# Patient Record
Sex: Male | Born: 1935 | Race: White | Hispanic: No | Smoking: Former smoker
Health system: Southern US, Community
[De-identification: ages and names within clinical notes are randomized; demographics above are authoritative.]

## PROBLEM LIST (undated history)

## (undated) DIAGNOSIS — N183 Chronic kidney disease, stage 3 unspecified: Secondary | ICD-10-CM

## (undated) DIAGNOSIS — E119 Type 2 diabetes mellitus without complications: Secondary | ICD-10-CM

## (undated) DIAGNOSIS — I10 Essential (primary) hypertension: Secondary | ICD-10-CM

## (undated) DIAGNOSIS — M1990 Unspecified osteoarthritis, unspecified site: Secondary | ICD-10-CM

## (undated) DIAGNOSIS — Z8619 Personal history of other infectious and parasitic diseases: Secondary | ICD-10-CM

## (undated) HISTORY — DX: Chronic kidney disease, stage 3 unspecified: N18.30

## (undated) HISTORY — DX: Essential (primary) hypertension: I10

## (undated) HISTORY — DX: Type 2 diabetes mellitus without complications: E11.9

## (undated) HISTORY — DX: Personal history of other infectious and parasitic diseases: Z86.19

## (undated) HISTORY — DX: Unspecified osteoarthritis, unspecified site: M19.90

---

## 2013-07-13 HISTORY — PX: TOTAL COLECTOMY: SHX852

## 2017-02-16 LAB — BASIC METABOLIC PANEL
BUN: 48 — AB (ref 4–21)
Glucose: 128
Potassium: 5.2 (ref 3.4–5.3)
Sodium: 140 (ref 137–147)

## 2017-02-16 LAB — HEMOGLOBIN A1C: Hemoglobin A1C: 6.4

## 2017-02-16 LAB — HEPATIC FUNCTION PANEL
ALT: 20 (ref 10–40)
AST: 17 (ref 14–40)
Bilirubin, Total: 0.5

## 2017-08-20 LAB — HEPATIC FUNCTION PANEL
ALT: 16 (ref 10–40)
AST: 16 (ref 14–40)
Alkaline Phosphatase: 44 (ref 25–125)
Bilirubin, Total: 0.5

## 2017-08-20 LAB — BASIC METABOLIC PANEL
Creatinine: 2.1 — AB (ref <=1.3)
Glucose: 120
Potassium: 4.6 (ref 3.4–5.3)
Sodium: 140 (ref 137–147)

## 2017-08-20 LAB — HEMOGLOBIN A1C: Hemoglobin A1C: 5.9

## 2017-12-24 LAB — BASIC METABOLIC PANEL
Glucose: 134
Potassium: 4.7 (ref 3.4–5.3)
Sodium: 149 — AB (ref 137–147)

## 2017-12-24 LAB — HEMOGLOBIN A1C: Hemoglobin A1C: 6.3

## 2017-12-24 LAB — HEPATIC FUNCTION PANEL
ALT: 13 (ref 10–40)
AST: 13 — AB (ref 14–40)
Bilirubin, Total: 0.6

## 2018-06-07 LAB — BASIC METABOLIC PANEL
BUN: 48 — AB (ref 4–21)
Creatinine: 2.3 — AB (ref <=1.3)
Glucose: 136
Potassium: 5.9 — AB (ref 3.4–5.3)
Sodium: 147 (ref 137–147)

## 2018-06-07 LAB — LIPID PANEL
Cholesterol: 184 (ref 0–200)
HDL: 64 (ref 35–70)
LDL Cholesterol: 99
Triglycerides: 110 (ref 40–160)

## 2018-06-07 LAB — CBC AND DIFFERENTIAL
HCT: 43 (ref 41–53)
Hemoglobin: 14.2 (ref 13.5–17.5)
Platelets: 159 (ref 150–399)
WBC: 6.1

## 2018-06-07 LAB — HEPATIC FUNCTION PANEL
ALT: 17 (ref 10–40)
AST: 15 (ref 14–40)
Alkaline Phosphatase: 50 (ref 25–125)
Bilirubin, Total: 0.5

## 2018-06-07 LAB — TSH: TSH: 1.91 (ref <=5.90)

## 2018-12-15 ENCOUNTER — Ambulatory Visit (INDEPENDENT_AMBULATORY_CARE_PROVIDER_SITE_OTHER): Payer: PPO | Admitting: Internal Medicine

## 2018-12-15 ENCOUNTER — Other Ambulatory Visit: Payer: Self-pay

## 2018-12-15 DIAGNOSIS — R413 Other amnesia: Secondary | ICD-10-CM | POA: Insufficient documentation

## 2018-12-15 DIAGNOSIS — N183 Chronic kidney disease, stage 3 unspecified: Secondary | ICD-10-CM

## 2018-12-15 DIAGNOSIS — R5383 Other fatigue: Secondary | ICD-10-CM | POA: Diagnosis not present

## 2018-12-15 DIAGNOSIS — I10 Essential (primary) hypertension: Secondary | ICD-10-CM

## 2018-12-15 DIAGNOSIS — E1122 Type 2 diabetes mellitus with diabetic chronic kidney disease: Secondary | ICD-10-CM | POA: Diagnosis not present

## 2018-12-16 LAB — CBC WITH DIFFERENTIAL/PLATELET
Basophils Absolute: 0.1 10*3/uL (ref 0.0–0.1)
Basophils Relative: 1.2 % (ref 0.0–3.0)
Eosinophils Absolute: 0.2 10*3/uL (ref 0.0–0.7)
Eosinophils Relative: 3.3 % (ref 0.0–5.0)
HCT: 40.2 % (ref 39.0–52.0)
Hemoglobin: 13.6 g/dL (ref 13.0–17.0)
Lymphocytes Relative: 25 % (ref 12.0–46.0)
Lymphs Abs: 1.6 10*3/uL (ref 0.7–4.0)
MCHC: 33.9 g/dL (ref 30.0–36.0)
MCV: 98.5 fl (ref 78.0–100.0)
Monocytes Absolute: 0.5 10*3/uL (ref 0.1–1.0)
Monocytes Relative: 7.5 % (ref 3.0–12.0)
Neutro Abs: 4.1 10*3/uL (ref 1.4–7.7)
Neutrophils Relative %: 63 % (ref 43.0–77.0)
Platelets: 163 10*3/uL (ref 150.0–400.0)
RBC: 4.08 Mil/uL — ABNORMAL LOW (ref 4.22–5.81)
RDW: 13.2 % (ref 11.5–15.5)
WBC: 6.6 10*3/uL (ref 4.0–10.5)

## 2018-12-16 LAB — BASIC METABOLIC PANEL
BUN: 50 mg/dL — ABNORMAL HIGH (ref 6–23)
CO2: 22 mEq/L (ref 19–32)
Calcium: 9.5 mg/dL (ref 8.4–10.5)
Chloride: 106 mEq/L (ref 96–112)
Creatinine, Ser: 1.86 mg/dL — ABNORMAL HIGH (ref 0.40–1.50)
GFR: 34.45 mL/min — ABNORMAL LOW (ref >60.00)
Glucose, Bld: 128 mg/dL — ABNORMAL HIGH (ref 70–99)
Potassium: 4.7 mEq/L (ref 3.5–5.1)
Sodium: 139 mEq/L (ref 135–145)

## 2018-12-16 LAB — LIPID PANEL
Cholesterol: 186 mg/dL (ref 0–200)
HDL: 49.9 mg/dL (ref >39.00)
LDL Cholesterol: 105 mg/dL — ABNORMAL HIGH (ref 0–99)
NonHDL: 135.77
Total CHOL/HDL Ratio: 4
Triglycerides: 154 mg/dL — ABNORMAL HIGH (ref 0.0–149.0)
VLDL: 30.8 mg/dL (ref 0.0–40.0)

## 2018-12-16 LAB — HEPATIC FUNCTION PANEL
ALT: 20 U/L (ref 0–53)
AST: 17 U/L (ref 0–37)
Albumin: 4.1 g/dL (ref 3.5–5.2)
Alkaline Phosphatase: 51 U/L (ref 39–117)
Bilirubin, Direct: 0.1 mg/dL (ref 0.0–0.3)
Total Bilirubin: 0.5 mg/dL (ref 0.2–1.2)
Total Protein: 6.8 g/dL (ref 6.0–8.3)

## 2018-12-16 LAB — VITAMIN B12: Vitamin B-12: 318 pg/mL (ref 211–911)

## 2018-12-16 LAB — TSH: TSH: 2.54 u[IU]/mL (ref 0.35–4.50)

## 2018-12-16 LAB — HEMOGLOBIN A1C: Hgb A1c MFr Bld: 7 % — ABNORMAL HIGH (ref 4.6–6.5)

## 2018-12-18 ENCOUNTER — Encounter: Payer: Self-pay | Admitting: Internal Medicine

## 2018-12-18 DIAGNOSIS — N183 Chronic kidney disease, stage 3 unspecified: Secondary | ICD-10-CM | POA: Insufficient documentation

## 2018-12-18 NOTE — Progress Notes (Signed)
Patient ID: Leroy Shepard, male   DOB: May 04, 1929, 85 y.o.   MRN: [id number]   Subjective:    Patient ID: Leroy Shepard, male    DOB: Oct 05, 1928, 85 y.o.   MRN: [id number]  HPI  Patient here for to establish care.  He has recently moved here from [place].  Living with his daughter and son-n-law.  Adjusting to the move.  Has a history of hypertension and diabetes.  He controls his diabetes with diet.  Is not very active.  Does report no chest pain or sob.  No acid reflux.  No abdominal pain.  Has ileostomy.  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  States ostomy is working well.  Eating well.  Has a good appetite.  No nausea or vomiting.  No urine change.  Answering questions appropriately.  Does report some possible depression.  States he feels down at times.  Discussed with him today.  Desires no further intervention at this time.     Past Medical History:  Diagnosis Date  . 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 Diabetes mellitus ([place])   . History of Clostridioides difficile colitis   . Hypertension   . Osteoarthritis    Past Surgical History:  Procedure Laterality Date  . TOTAL COLECTOMY  2015   toxic megacolon with resulting ileostomy   History reviewed. No pertinent family history. Social History   Socioeconomic History  . Marital status: Single    Spouse name: Not on file  . Number of children: Not on file  . Years of education: Not on file  . Highest education level: Not on file  Occupational History  . Not on file  Social Needs  . Financial resource strain: Not on file  . Food insecurity:    Worry: Not on file    Inability: Not on file  . Transportation needs:    Medical: Not on file    Non-medical: Not on file  Tobacco Use  . Smoking status: Former Research scientist (life sciences)  . Smokeless tobacco: Never Used  Substance and Sexual Activity  . Alcohol use: Not on file  . Drug  use: Not on file  . Sexual activity: Not on file  Lifestyle  . Physical activity:    Days per week: Not on file    Minutes per session: Not on file  . Stress: Not on file  Relationships  . Social connections:    Talks on phone: Not on file    Gets together: Not on file    Attends religious service: Not on file    Active member of club or organization: Not on file    Attends meetings of clubs or organizations: Not on file    Relationship status: Not on file  Other Topics Concern  . Not on file  Social History Narrative  . Not on file    Outpatient Encounter Medications as of 12/15/2018  Medication Sig  . Alpha-D-Galactosidase (BEANO PO) Take by mouth.  . Ascorbic Acid (VITAMIN C PO) Take by mouth daily.  . cholecalciferol (VITAMIN D3) 25 MCG (1000 UT) tablet Take 1,000 Units by mouth daily.  Marland Kitchen donepezil (ARICEPT) 5 MG tablet Take 5 mg by mouth at bedtime.  Marland Kitchen loratadine (CLARITIN) 10 MG tablet Take 10 mg by mouth daily.  Marland Kitchen losartan (COZAAR) 100 MG tablet Take 100 mg by mouth daily.  Marland Kitchen oxybutynin (DITROPAN-XL) 10 MG 24 hr tablet  Take 10 mg by mouth at bedtime.  . Simethicone (GAS-X PO) Take by mouth as needed.  . tamsulosin (FLOMAX) 0.4 MG CAPS capsule Take 0.4 mg by mouth daily after supper.   No facility-administered encounter medications on file as of 12/15/2018.     Review of Systems  Constitutional: Negative for appetite change and unexpected weight change.  HENT: Negative for congestion and sinus pressure.   Respiratory: Negative for cough, chest tightness and shortness of breath.   Cardiovascular: Negative for chest pain, palpitations and leg swelling.  Gastrointestinal: Negative for abdominal pain, nausea and vomiting.  Genitourinary: Negative for difficulty urinating and dysuria.  Musculoskeletal: Negative for joint swelling.  Skin: Negative for color change and rash.  Neurological: Negative for dizziness, light-headedness and headaches.  Psychiatric/Behavioral:  Negative for agitation.       Feels down intermittently.         Objective:    Physical Exam Constitutional:      General: He is not in acute distress.    Appearance: Normal appearance. He is well-developed.  HENT:     Head: Normocephalic and atraumatic.     Right Ear: External ear normal.     Left Ear: External ear normal.  Eyes:     General:        Right eye: No discharge.        Left eye: No discharge.     Conjunctiva/sclera: Conjunctivae normal.  Neck:     Musculoskeletal: Neck supple. No muscular tenderness.  Cardiovascular:     Rate and Rhythm: Normal rate and regular rhythm.  Pulmonary:     Effort: Pulmonary effort is normal. No respiratory distress.     Breath sounds: Normal breath sounds.  Abdominal:     General: Bowel sounds are normal.     Palpations: Abdomen is soft.     Tenderness: There is no abdominal tenderness.     Comments: No tenderness to palpation around the ostomy.  No surrounding the erythema.    Musculoskeletal:        General: No swelling or tenderness.  Lymphadenopathy:     Cervical: No cervical adenopathy.  Skin:    Findings: No erythema or rash.  Neurological:     Mental Status: He is alert.     Comments: New year.  Unable to give month of day of month.  Able to spell world backwards and subtract serial 7s.  New the president.  Able to recall 1/3 objects.    Psychiatric:        Mood and Affect: Mood normal.        Behavior: Behavior normal.     BP 122/62   Pulse 80   Temp 97.9 F (36.6 C) (Oral)   Resp 16   Ht 5' 5"  (1.651 m)   Wt 175 lb (79.4 kg)   SpO2 97%   BMI 29.12 kg/m  Wt Readings from Last 3 Encounters:  12/15/18 175 lb (79.4 kg)     Lab Results  Component Value Date   WBC 6.6 12/15/2018   HGB 13.6 12/15/2018   HCT 40.2 12/15/2018   PLT 163.0 12/15/2018   GLUCOSE 128 (H) 12/15/2018   CHOL 186 12/15/2018   TRIG 154.0 (H) 12/15/2018   HDL 49.90 12/15/2018   LDLCALC 105 (H) 12/15/2018   ALT 20 12/15/2018   AST  17 12/15/2018   NA 139 12/15/2018   K 4.7 12/15/2018   CL 106 12/15/2018   CREATININE 1.86 (H) 12/15/2018  BUN 50 (H) 12/15/2018   CO2 22 12/15/2018   TSH 2.54 12/15/2018   HGBA1C 7.0 (H) 12/15/2018    Patient was never admitted.     Assessment & Plan:   Problem List Items Addressed This Visit    CKD (chronic kidney disease), stage III ([place])    Follow metabolic panel.  Avoid antiinflammatories.        Fatigue    Felt to be multifactorial.  Discussed with him today.  Check routine labs.  Will follow.  Discussed his recent move and transition. Discussed his mood, etc.  Desires no further intervention at this time.  Will follow.        Hypertension, essential    Controlled on current medication regimen.  Follow pressures.  Follow metabolic panel.        Relevant Medications   losartan (COZAAR) 100 MG tablet   Other Relevant Orders   CBC with Differential/Platelet (Completed)   Memory change    Check routine labs including cbc, metabolic panel, thyroid test and B12 level.        Relevant Orders   TSH (Completed)   Vitamin B12 (Completed)   Type 2 diabetes mellitus with diabetic chronic kidney disease ([place])    Controls with diet.  On no medication.  Follow met b and a1c.        Relevant Medications   losartan (COZAAR) 100 MG tablet   Other Relevant Orders   Hepatic function panel (Completed)   Hemoglobin A1c (Completed)   Lipid panel (Completed)   Basic metabolic panel (Completed)       Einar Pheasant, MD

## 2018-12-18 NOTE — Assessment & Plan Note (Signed)
Controls with diet.  On no medication.  Follow met b and a1c.   

## 2018-12-18 NOTE — Assessment & Plan Note (Signed)
Follow metabolic panel.  Avoid antiinflammatories.   

## 2018-12-18 NOTE — Assessment & Plan Note (Signed)
Controlled on current medication regimen.  Follow pressures.  Follow metabolic panel.

## 2018-12-18 NOTE — Assessment & Plan Note (Signed)
Felt to be multifactorial.  Discussed with him today.  Check routine labs.  Will follow.  Discussed his recent move and transition. Discussed his mood, etc.  Desires no further intervention at this time.  Will follow.

## 2018-12-18 NOTE — Assessment & Plan Note (Signed)
Check routine labs including cbc, metabolic panel, thyroid test and B12 level.

## 2018-12-19 ENCOUNTER — Other Ambulatory Visit: Payer: Self-pay | Admitting: Internal Medicine

## 2018-12-19 MED ORDER — CYANOCOBALAMIN 1000 MCG/ML IJ SOLN: INTRAMUSCULAR | 1 refills | Status: DC

## 2018-12-19 NOTE — Progress Notes (Signed)
rx sent in for B12

## 2018-12-22 ENCOUNTER — Other Ambulatory Visit: Payer: Self-pay | Admitting: Internal Medicine

## 2018-12-22 DIAGNOSIS — Z9049 Acquired absence of other specified parts of digestive tract: Secondary | ICD-10-CM | POA: Insufficient documentation

## 2018-12-22 NOTE — Progress Notes (Signed)
Order placed for surgery consultation.

## 2018-12-27 ENCOUNTER — Encounter: Payer: Self-pay | Admitting: Internal Medicine

## 2018-12-29 ENCOUNTER — Other Ambulatory Visit: Payer: Self-pay

## 2018-12-29 ENCOUNTER — Ambulatory Visit (INDEPENDENT_AMBULATORY_CARE_PROVIDER_SITE_OTHER): Payer: HMO | Admitting: General Surgery

## 2018-12-29 DIAGNOSIS — Z432 Encounter for attention to ileostomy: Secondary | ICD-10-CM

## 2018-12-29 NOTE — Progress Notes (Addendum)
Patient ID: Leroy Shepard, male   DOB: [birth year], 85 y.o.   MRN: [id number]  No chief complaint on file.   HPI Leroy Shepard is a 85 y.o. male.  The patient recently moved from [place] to [place] to live with his daughter.  4 years ago he underwent an emergency colectomy for C. difficile colitis and has a permanent ileostomy.  The family handles stoma management. The patient was seen at his home. HPI  Past Medical History:  Diagnosis Date  . 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 Diabetes mellitus ([place])   . History of Clostridioides difficile colitis   . Hypertension   . Osteoarthritis     Past Surgical History:  Procedure Laterality Date  . TOTAL COLECTOMY  2015   toxic megacolon with resulting ileostomy  . TOTAL COLECTOMY      Family History  Problem Relation Age of Onset  . Cancer Sister     Social History Social History   Tobacco Use  . Smoking status: Former Research scientist (life sciences)  . Smokeless tobacco: Never Used  Substance Use Topics  . Alcohol use: Not on file  . Drug use: Not on file    Not on File  Current Outpatient Medications  Medication Sig Dispense Refill  . Alpha-D-Galactosidase (BEANO PO) Take by mouth.    . Ascorbic Acid (VITAMIN C PO) Take by mouth daily.    . cholecalciferol (VITAMIN D3) 25 MCG (1000 UT) tablet Take 1,000 Units by mouth daily.    . cyanocobalamin (,VITAMIN B-12,) 1000 MCG/ML injection Inject 1069mcg q week x 4 weeks and then 1038mcg q month. 10 mL 1  . donepezil (ARICEPT) 5 MG tablet Take 5 mg by mouth at bedtime.    Marland Kitchen loratadine (CLARITIN) 10 MG tablet Take 10 mg by mouth daily.    Marland Kitchen losartan (COZAAR) 100 MG tablet Take 100 mg by mouth daily.    Marland Kitchen oxybutynin (DITROPAN-XL) 10 MG 24 hr tablet Take 10 mg by mouth at bedtime.    . Simethicone (GAS-X PO) Take by mouth as needed.    . tamsulosin (FLOMAX) 0.4 MG CAPS capsule Take 0.4 mg by mouth daily after supper.      No current facility-administered medications for this visit.     Review of Systems Review of Systems  Respiratory: Negative.   Cardiovascular: Negative.   Gastrointestinal: Negative.   Endocrine: Positive for cold intolerance.  Genitourinary: Negative.     There were no vitals taken for this visit.  Physical Exam Physical Exam Abdominal:     General: Abdomen is protuberant. A surgical scar is present. There is no distension.     Palpations: Abdomen is soft.       Data Reviewed CBC of December 15, 2018 reviewed.  Hemoglobin of 13.6 with an MCV of 98.  White blood cell count of 6600 with normal differential.  Platelet count of 163,000. Hemoglobin A1c of 7.0.  Normal hepatic function panel.  Assessment Healthy ileostomy status post colectomy for C. difficile colitis.  Plan No issues at times time and follow-up will be on an as-needed basis.    Forest Gleason Deette Revak 12/29/2018, 7:42 PM

## 2019-01-05 ENCOUNTER — Other Ambulatory Visit: Payer: Self-pay | Admitting: Internal Medicine

## 2019-01-05 DIAGNOSIS — T17308A Unspecified foreign body in larynx causing other injury, initial encounter: Secondary | ICD-10-CM

## 2019-01-05 DIAGNOSIS — R413 Other amnesia: Secondary | ICD-10-CM

## 2019-01-05 NOTE — Progress Notes (Signed)
Order placed for neurology referral and ENT referral.

## 2019-01-06 ENCOUNTER — Telehealth: Payer: Self-pay

## 2019-01-06 DIAGNOSIS — Z432 Encounter for attention to ileostomy: Secondary | ICD-10-CM

## 2019-01-06 NOTE — Telephone Encounter (Signed)
DME printed for ileostomy supplies

## 2019-01-20 ENCOUNTER — Other Ambulatory Visit: Payer: Self-pay | Admitting: Internal Medicine

## 2019-01-20 MED ORDER — BUPROPION HCL 75 MG PO TABS: 75.0000 mg | ORAL_TABLET | Freq: Two times a day (BID) | ORAL | 1 refills | Status: DC

## 2019-01-20 NOTE — Progress Notes (Signed)
rx sent in for wellbutrin 75mg  bid #60 with no refills.

## 2019-01-25 DIAGNOSIS — J9589 Other postprocedural complications and disorders of respiratory system, not elsewhere classified: Secondary | ICD-10-CM | POA: Diagnosis not present

## 2019-01-26 ENCOUNTER — Other Ambulatory Visit: Payer: Self-pay | Admitting: Unknown Physician Specialty

## 2019-01-26 DIAGNOSIS — R1312 Dysphagia, oropharyngeal phase: Secondary | ICD-10-CM

## 2019-01-29 ENCOUNTER — Encounter: Payer: Self-pay | Admitting: Internal Medicine

## 2019-01-30 MED ORDER — OXYBUTYNIN CHLORIDE ER 10 MG PO TB24: 10.0000 mg | ORAL_TABLET | Freq: Every day | ORAL | 1 refills | Status: DC

## 2019-01-30 NOTE — Telephone Encounter (Signed)
rx sent in for ditropan #90 with one refill.

## 2019-01-30 NOTE — Telephone Encounter (Signed)
Just wanted to confirm that we are taking over all of his medications.

## 2019-02-01 ENCOUNTER — Encounter: Payer: Self-pay | Admitting: Internal Medicine

## 2019-02-01 DIAGNOSIS — R1310 Dysphagia, unspecified: Secondary | ICD-10-CM | POA: Insufficient documentation

## 2019-02-08 ENCOUNTER — Telehealth: Payer: Self-pay

## 2019-02-08 NOTE — Telephone Encounter (Signed)
Leroy Shepard with [facility] calling to find out what supplies the patient is supposed to get.

## 2019-02-08 NOTE — Telephone Encounter (Signed)
[facility] to f/u on ostomy supplies. Printed last office notes. Faxed dme, OV notes, insurance card to the 3 different fax numbers given. Expecting a call from [employer] to have "parachute app" set up so we can order online.

## 2019-02-09 NOTE — Telephone Encounter (Signed)
[facility] and gave the reference numbers for wafers, skin prep, and adhesive remover. Confirmed with Bethany at [employer] that all info has been received and order is being processed

## 2019-02-10 DIAGNOSIS — K5931 Toxic megacolon: Secondary | ICD-10-CM | POA: Diagnosis not present

## 2019-02-10 DIAGNOSIS — Z932 Ileostomy status: Secondary | ICD-10-CM | POA: Diagnosis not present

## 2019-02-10 DIAGNOSIS — E1122 Type 2 diabetes mellitus with diabetic chronic kidney disease: Secondary | ICD-10-CM | POA: Diagnosis not present

## 2019-02-10 DIAGNOSIS — N183 Chronic kidney disease, stage 3 (moderate): Secondary | ICD-10-CM | POA: Diagnosis not present

## 2019-02-20 ENCOUNTER — Ambulatory Visit: Payer: PPO

## 2019-02-21 ENCOUNTER — Encounter: Payer: Self-pay | Admitting: Neurology

## 2019-02-21 ENCOUNTER — Other Ambulatory Visit: Payer: Self-pay

## 2019-02-21 ENCOUNTER — Telehealth (INDEPENDENT_AMBULATORY_CARE_PROVIDER_SITE_OTHER): Payer: PPO | Admitting: Neurology

## 2019-02-21 VITALS — Ht 65.0 in | Wt 175.0 lb

## 2019-02-21 DIAGNOSIS — F0390 Unspecified dementia without behavioral disturbance: Secondary | ICD-10-CM | POA: Diagnosis not present

## 2019-02-21 DIAGNOSIS — N183 Chronic kidney disease, stage 3 (moderate): Secondary | ICD-10-CM | POA: Diagnosis not present

## 2019-02-21 DIAGNOSIS — F03A0 Unspecified dementia, mild, without behavioral disturbance, psychotic disturbance, mood disturbance, and anxiety: Secondary | ICD-10-CM

## 2019-02-21 DIAGNOSIS — Z932 Ileostomy status: Secondary | ICD-10-CM | POA: Diagnosis not present

## 2019-02-21 DIAGNOSIS — E1122 Type 2 diabetes mellitus with diabetic chronic kidney disease: Secondary | ICD-10-CM | POA: Diagnosis not present

## 2019-02-21 DIAGNOSIS — K5931 Toxic megacolon: Secondary | ICD-10-CM | POA: Diagnosis not present

## 2019-02-21 NOTE — Progress Notes (Signed)
Virtual Visit via Video Note The purpose of this virtual visit is to provide medical care while limiting exposure to the novel coronavirus.    Consent was obtained for video visit:  Yes.   Answered questions that patient had about telehealth interaction:  Yes.   I discussed the limitations, risks, security and privacy concerns of performing an evaluation and management service by telemedicine. I also discussed with the patient that there may be a patient responsible charge related to this service. The patient expressed understanding and agreed to proceed.  Pt location: Home Physician Location: office Name of referring provider:  Dale DurhamScott, Charlene, MD I connected with Leroy Shepard at patients initiation/request on 02/21/2019 at 10:30 AM EDT by video enabled telemedicine application and verified that I am speaking with the correct person using two identifiers. Pt MRN:  [id number] Pt DOB:  December 23, 1928 Video Participants:  Leroy Shepard;  Dr. Duncan Dulleresa Holstad (daughter)   History of Present Illness:  This is an 85 year old right-handed man with a history of hypertension, toxic megacolon s/p total colectomy with ileostomy, presenting for evaluation of worsening memory. His daughter Dr. Darrick Huntsmanullo is present during the visit to provide additional information. When asked about his memory, he states "I don't know, I have a hearing problem so sometimes cannot hear." His daughter started noticing changes a year ago when she was visiting him in [place]. His dining table used to be very organized but was cluttered with unpaid bills. He has a girlfriend Nelva Bushorma of 7 years who also expressed concern about worsening memory. He has 2 caregivers for the past 4 years helping him with his ileostomy bag, preparing meals, doing laundry. One of them noticed catfood in the fridge, he does not have a cat. He has a pillbox and his caregivers noticed he was not taking his night time medications. He leased a car and had not paid in 3  months, he does not recall this. He denies getting lost driving, his caregiver noticed a dent on the side of the car one time. He was started by his PCP in [place] on Donepezil 5mg  daily which caused lethargy. Due to increasing concerns about him living alone in [place], [place] [facility] [place] [facility] [place]. He states he has been here for the past week. His daughter reports his short-term memory is gone, he has difficulty following instructions because he would not remember them 10 minutes later. He is able to bathe and dress independently but has no initiative and needs reminders. He has been depressed with moving to [place], missing his girlfriend. He was started on Wellbutrin 75mg  BID last month and his daughter has noticed an improvement, he has more initiative to do things. No paranoia or hallucinations. Sleep is good, he sleeps for 20 hours in a day, taking naps in the day. He snores. His daughter has noticed improvement when he wears a chin trap or sleeps on a wedge. His daughter feels his hearing is fine. Appetite is good, he has difficulty swallowing liquids and is scheduled for a swallow evaluation this month. He denies any headaches, dizziness, focal numbness/tingling/weakness, anosmia, or tremors. He has double vision without his glasses. No falls, he always carries his cane. No family history of dementia. No history of significant head injuries. He rarely drinks alcohol.  His B12 level was 318, he is now on B12 injections. TSH normal.   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     Ascorbic Acid (VITAMIN C PO) Take by mouth daily.     buPROPion (WELLBUTRIN) 75 MG tablet Take 1 tablet (75 mg total) by mouth 2 (two) times daily. 60 tablet 1   cholecalciferol (VITAMIN D3) 25 MCG (1000 UT) tablet Take 1,000 Units by mouth daily.     cyanocobalamin (,VITAMIN B-12,) 1000 MCG/ML injection Inject 1000mcg q week x 4 weeks and then 1000mcg q month. 10 mL 1   loratadine (CLARITIN) 10 MG tablet Take 10 mg by mouth daily.     losartan (COZAAR) 100 MG tablet Take 100 mg by mouth daily.     oxybutynin (DITROPAN-XL) 10 MG 24 hr tablet Take 1 tablet (10 mg total) by mouth at bedtime. 90 tablet 1   Simethicone (GAS-X PO) Take by mouth as needed.     tamsulosin (FLOMAX) 0.4 MG CAPS capsule Take 0.4 mg by mouth daily after supper.     donepezil (ARICEPT) 5 MG tablet Take 5 mg by mouth at bedtime.     No current facility-administered medications on file prior to visit.     ALLERGIES: Not on File  FAMILY HISTORY: Family History  Problem Relation Age of Onset   Cancer Sister       Observations/Objective:   Vitals:   02/21/19 1010  Weight: 175 lb (79.4 kg)  Height: 5\' 5"  (1.651 m)   GEN:  The patient appears stated age and is in NAD.  Neurological examination: Patient is awake, alert, oriented x 3. No aphasia or dysarthria. Reduced fluency, able to follow commands. Remote and recent memory impaired. Able to name and repeat. Cranial nerves: Extraocular movements intact with no nystagmus. No facial asymmetry. Motor: moves all extremities symmetrically, at least anti-gravity x 4. No incoordination on finger to nose testing. Gait: slow and cautious carrying cane, no ataxia. Negative Romberg test.  Montreal Cognitive Assessment Blind 02/21/2019  Attention: Read list of digits (0/2) 2  Attention: Read list of letters (0/1) 1  Attention:  Serial 7 subtraction starting at 100 (0/3) 2  Language: Repeat phrase (0/2) 2  Language : Fluency (0/1) 0  Abstraction (0/2) 2  Delayed Recall (0/5) 0  Orientation (0/6) 6  Total 15/22    Assessment and Plan:   This is an 85 year old right-handed man with a history of hypertension, toxic megacolon s/p total colectomy with ileostomy, presenting for evaluation of worsening memory. His MOCA blind (done over phone) today is 15/22, symptoms suggestive of mild dementia. MRI brain without contrast will be ordered to assess for underlying structural abnormality. He is on B12 replacement, continue with B12 injections. His daughter noticed improvement in energy with Wellbutrin and will restart Donepezil 5mg  daily. If lethargy recurs, we will switch to Rivastigmine. We discussed how sleep apnea could also cause cognitive changes, however he would not be wearing the CPAP so we agreed to hold off on sleep study at this time. We discussed how mood can affect memory, Wellbutrin can be uptitrated if necessary. The diagnosis and prognosis  were discussed with the patient and his daughter, including eventual need for more help. He will be staying with his daughter for closer supervision at this time. He is not driving. Follow-up in 6 months, they know to call for any changes.   Follow Up Instructions: -I discussed the assessment and treatment plan with the patient. The patient was provided an opportunity to ask questions and all were answered. The patient agreed with the plan and demonstrated an understanding of the instructions.   The patient was advised to call back or seek an in-person evaluation if the symptoms worsen or if the condition fails to improve as anticipated.     Cameron Sprang, MD

## 2019-03-07 ENCOUNTER — Ambulatory Visit
Admission: RE | Admit: 2019-03-07 | Discharge: 2019-03-07 | Disposition: A | Discharge status: Home / Self Care | Payer: PPO | Source: Ambulatory Visit | Attending: Unknown Physician Specialty | Admitting: Unknown Physician Specialty

## 2019-03-07 ENCOUNTER — Other Ambulatory Visit: Payer: Self-pay

## 2019-03-07 DIAGNOSIS — R1312 Dysphagia, oropharyngeal phase: Secondary | ICD-10-CM | POA: Diagnosis not present

## 2019-03-07 DIAGNOSIS — R1310 Dysphagia, unspecified: Secondary | ICD-10-CM | POA: Diagnosis not present

## 2019-03-07 DIAGNOSIS — R1313 Dysphagia, pharyngeal phase: Secondary | ICD-10-CM | POA: Insufficient documentation

## 2019-03-07 NOTE — Therapy (Addendum)
[place], [place], [address] Phone: [phone]   Fax:     Modified Barium Swallow  Patient Details  Name: Leroy Shepard MRN: [id number] Date of Birth: January 05, 1929 No data recorded  Encounter Date: 03/07/2019  End of Session - 03/07/19 1459    Visit Number  1    Number of Visits  1    Date for SLP Re-Evaluation  03/07/19    SLP Start Time  32    SLP Stop Time   1400    SLP Time Calculation (min)  60 min    Activity Tolerance  Patient tolerated treatment well       Past Medical History:  Diagnosis Date  . 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 Diabetes mellitus ([place])   . History of Clostridioides difficile colitis   . Hypertension   . Osteoarthritis     Past Surgical History:  Procedure Laterality Date  . TOTAL COLECTOMY  2015   toxic megacolon with resulting ileostomy    There were no vitals filed for this visit.       Subjective: Patient behavior: (alertness, ability to follow instructions, etc.): pt pleasant, engaging w/ no overt speech-language deficits. "Memory change" has been noted per chart/referral note. Pt denied any Neurological deficits; none noted per chart history except the mentioned change in Memory. Pt denied any GI history/deficits; no Reflux. (However, min-mod Belching noted post MBSS so unsure of his baseline re: Reflux issues.) Pt has native Dentition; No oral motor weakness noted in lingual/labial movements.  Chief complaint: dysphagia   Objective:  Radiological Procedure: A videoflouroscopic evaluation of oral-preparatory, reflex initiation, and pharyngeal phases of the swallow was performed; as well as a screening of the upper esophageal phase.  I. POSTURE: upright II. VIEW: lateral III. COMPENSATORY STRATEGIES: attempted chin tuck position; strong Cough w/ f/u swallow; small, single  sips via Cup IV. BOLUSES ADMINISTERED:  Thin Liquid: 6 trials  Nectar-thick Liquid: 4 trials  Honey-thick Liquid: NT  Puree: 3 trials  Mechanical Soft: 2 trials V. RESULTS OF EVALUATION: A. ORAL PREPARATORY PHASE: (The lips, tongue, and velum are observed for strength and coordination)       **Overall Severity Rating: WFL.   B. SWALLOW INITIATION/REFLEX: (The reflex is normal if "triggered" by the time the bolus reached the base of the tongue)  **Overall Severity Rating: MOD. Delayed pharyngeal swallow initiation present w/ delay in Epiglottic inversion resulting in inadequate timing of airway closure - this lead to laryngeal Penetration and Aspiration of thin liquid consistency trials. The Penetration and Aspiration was SILENT. The Penetration/Aspiration appeared to clear w/ use of a strong cough/throat clear and f/u swallow.   C. PHARYNGEAL PHASE: (Pharyngeal function is normal if the bolus shows rapid, smooth, and continuous transit through the pharynx and there is no pharyngeal residue after the swallow)  **Overall Severity Rating: [facility].  D. LARYNGEAL PENETRATION: (Material entering into the laryngeal inlet/vestibule but not aspirated): x3 w/ thin liquids E. ASPIRATION: x3 w/ thin liquids F. ESOPHAGEAL PHASE: (Screening of the upper esophagus): unsure if min+ anterior/posterior tissue protrusion was noted in Cervical Esophagus just below the UES - recommend monitoring for CP Bar development.     ASSESSMENT: Pt appears to present w/ pharyngeal phase dysphagia w/ both laryngeal Penetration and Aspiration of thin liquid consistency - this was SILENT in nature. Pt was able to use strategy of  strong Cough and f/u, dry swallow to (apparantly) clear the material. NO laryngeal Penetration or Aspiration were noted w/ trials of Nectar consistency liquids, puree, and soft solids. While this presentation is consistent w/ abnormal physiology of swallowing, it can also be seen in chronic effects of LPR.  Pt was Belching post exam and Aerophagia was noted during trials presented. During brief Cervical screen/sweep, unsure if min+ anterior/posterior tissue protrusion/narrowing was noted in the Cervical Esophagus just below the UES - recommend monitoring for CP Bar development. Oral phase [facility] for oral management and mastication; A-P transfer and oral clearing was appropriate. During the pharyngeal phase, min delayed pharyngeal swallow initiation was present w/ min delay in Epiglottic inversion resulting in inadequate timing of airway closure w/ thin liquids; also suspect decreased TIGHT closure of the tissue of the entrance to the laryngeal Vestibule. This presentation led to laryngeal Penetration and Aspiration of thin liquid consistency trials. The Penetration and Aspiration again was SILENT. The Penetrated/Aspirated material appeared to clear w/ use of a strong cough/throat clear and f/u swallow.  Recommend pt and family discussion w/ pt's PCP to determine pt's Pulmonary tolerance for more than trace, Silent laryngeal Penetration and Aspiration. Recommend f/u w/ skilled ST services to continue Education w/ pt/family on Dysphagia and to learn and practice further strategies (to include the Superglottic swallow strategy) to implement during oral intake of thin liquids - a repeat MBSS in the future will be necessary to determine efficacy and safety of use of any strategies d/t the Silent nature of pt's Aspiration. This was explained to pt and family member w/ pt. Handout and Nectar consistency liquids were given to pt to take home.      PLAN/RECOMMENDATIONS:  A. Diet: Regular consistency diet w/ Nectar consistency liquids; Pills swallowed in a Puree for safety of swallow.   B. Swallowing Precautions: aspiration precautions; Reflux precautions   C. Recommended consultation to: GI to explore possible Reflux, and management of such as indicated  D. Therapy recommendations: recommend f/u w/ ST services  for Dysphagia tx; education  E. Results and recommendations were discussed w/ pt and family member w/ pt; video viewed w/ pt and questions answered. Discussed use of Nectar consistency liquids and need to monitor pt's Pulmonary status for any decline. Nectar consistency liquids given to take home, handout on precautions.          Patient will benefit from skilled therapeutic intervention in order to improve the following deficits and impairments:   Dysphagia, pharyngeal phase  Oropharyngeal dysphagia - Plan: DG SWALLOW FUNC OP MEDICARE SPEECH PATH, DG SWALLOW FUNC OP MEDICARE SPEECH PATH        Problem List Patient Active Problem List   Diagnosis Date Noted  . Dysphagia 02/01/2019  . Ileostomy care (HCC) 12/29/2018  . History of total colectomy 12/22/2018  . CKD (chronic kidney disease), stage III (HCC) 12/18/2018  . Fatigue 12/15/2018  . Memory change 12/15/2018  . Hypertension, essential 12/15/2018  . Type 2 diabetes mellitus with diabetic chronic kidney disease (HCC) 12/15/2018        Jerilynn SomKatherine Watson, MS, CCC-SLP Watson,Katherine 03/07/2019, 3:00 PM  [facility] [address] [place], [place], [address] Phone: [phone]   Fax:     Name: Leroy Shepard MRN: [id number] Date of Birth: [birth year]

## 2019-03-27 ENCOUNTER — Telehealth: Payer: Self-pay | Admitting: Internal Medicine

## 2019-03-27 ENCOUNTER — Other Ambulatory Visit: Payer: Self-pay | Admitting: Internal Medicine

## 2019-03-27 MED ORDER — OXYBUTYNIN 3.9 MG/24HR TD PTTW: 1.0000 | MEDICATED_PATCH | TRANSDERMAL | 3 refills | Status: DC

## 2019-03-27 MED ORDER — BUPROPION HCL ER (SR) 100 MG PO TB12: 100.0000 mg | ORAL_TABLET | Freq: Two times a day (BID) | ORAL | 2 refills | Status: DC

## 2019-03-27 NOTE — Progress Notes (Signed)
rx sent in for oxytrol patch and wellbutrin 100mg  as requested

## 2019-03-27 NOTE — Telephone Encounter (Signed)
-----   Message from Crecencio Mc, MD sent at 03/26/2019 11:27 PM EDT ----- Regarding: medication changes for [name],  I discussed the  alternative bladder agents with Cate.  Oxybutynin   XL  is definitely out since it is metabolized in the colon .  Tolterodine metabolism is blocked by buproprion so the anticholinergic side effects may worsen.  The best choice then would be the Oxytrol patch   (or if it's cheaper in a twice daily pill , that's fine  too.  We use a pill box)  He took his last wellbutrin tablet this morning,  and I think he's done well on it but I would like to increase his dose to 100 mg twice daily if you agree  He's still using [place]  Thank you so much for being his doctor.   Helene Kelp

## 2019-03-28 ENCOUNTER — Telehealth: Payer: Self-pay | Admitting: Internal Medicine

## 2019-03-28 MED ORDER — OXYBUTYNIN CHLORIDE 5 MG PO TABS: 5.0000 mg | ORAL_TABLET | Freq: Three times a day (TID) | ORAL | 2 refills | Status: DC

## 2019-03-28 NOTE — Telephone Encounter (Signed)
rx sent in for oxbutynin 5mg  tid #90 with 2 refills.

## 2019-03-28 NOTE — Telephone Encounter (Signed)
-----   Message from Crecencio Mc, MD sent at 03/28/2019 10:21 AM EDT ----- Regarding: bladder med Sorry to bother yoy with this again.  Pharmacy can't get the oxytrol patch but recommended using oxybutynin immediate release 5 mg tid for Dad because it should be absorbed in the small intestine, not the large.  Thanks again  T2

## 2019-03-30 ENCOUNTER — Ambulatory Visit
Admission: RE | Admit: 2019-03-30 | Discharge: 2019-03-30 | Disposition: A | Discharge status: Home / Self Care | Payer: PPO | Source: Ambulatory Visit | Attending: Neurology | Admitting: Neurology

## 2019-03-30 ENCOUNTER — Other Ambulatory Visit: Payer: Self-pay

## 2019-03-30 DIAGNOSIS — F0390 Unspecified dementia without behavioral disturbance: Secondary | ICD-10-CM | POA: Diagnosis not present

## 2019-03-30 DIAGNOSIS — F03A0 Unspecified dementia, mild, without behavioral disturbance, psychotic disturbance, mood disturbance, and anxiety: Secondary | ICD-10-CM

## 2019-03-31 ENCOUNTER — Telehealth: Payer: Self-pay

## 2019-03-31 NOTE — Telephone Encounter (Signed)
-----   Message from [place], DO sent at 03/31/2019  2:50 PM EDT ----- You can let pt know that brain has shrunk some with age and that there is some mild hardening of arteries but otherwise normal

## 2019-03-31 NOTE — Telephone Encounter (Signed)
Pts daughter, Dr. Derrel Nip informed of results. No concerns at this time.

## 2019-04-04 ENCOUNTER — Other Ambulatory Visit: Payer: Self-pay

## 2019-04-04 ENCOUNTER — Other Ambulatory Visit (INDEPENDENT_AMBULATORY_CARE_PROVIDER_SITE_OTHER): Payer: PPO

## 2019-04-04 ENCOUNTER — Telehealth: Payer: Self-pay | Admitting: Internal Medicine

## 2019-04-04 DIAGNOSIS — R410 Disorientation, unspecified: Secondary | ICD-10-CM | POA: Diagnosis not present

## 2019-04-04 LAB — POCT URINALYSIS DIP (MANUAL ENTRY)
Bilirubin, UA: NEGATIVE
Glucose, UA: NEGATIVE mg/dL
Ketones, POC UA: NEGATIVE mg/dL
Leukocytes, UA: NEGATIVE
Nitrite, UA: NEGATIVE
Spec Grav, UA: 1.02 (ref 1.010–1.025)
Urobilinogen, UA: 0.2 E.U./dL
pH, UA: 5 (ref 5.0–8.0)

## 2019-04-04 NOTE — Telephone Encounter (Signed)
-----   Message from Crecencio Mc, MD sent at 04/04/2019  1:06 PM EDT ----- It may be a medication side effect of the new oxybutynin,  but dad was confused this morning,  and still had noctura x 3.  I brought in a urine sample in a sterile urine container.  Do you mind ordering a UA /culture on him?it's in the lab.  Thank you  TT

## 2019-04-04 NOTE — Telephone Encounter (Signed)
Orders placed urine and culture

## 2019-04-04 NOTE — Addendum Note (Signed)
Addended by: Leeanne Rio on: 04/04/2019 02:01 PM   Modules accepted: Orders

## 2019-04-05 LAB — URINALYSIS, MICROSCOPIC ONLY

## 2019-04-06 ENCOUNTER — Encounter: Payer: Self-pay | Admitting: Internal Medicine

## 2019-04-06 LAB — URINE CULTURE
MICRO NUMBER:: 908686
SPECIMEN QUALITY:: ADEQUATE

## 2019-04-24 ENCOUNTER — Telehealth: Payer: Self-pay | Admitting: Internal Medicine

## 2019-04-24 MED ORDER — BUPROPION HCL ER (SR) 150 MG PO TB12: 150.0000 mg | ORAL_TABLET | Freq: Two times a day (BID) | ORAL | 1 refills | Status: AC

## 2019-04-24 NOTE — Telephone Encounter (Signed)
-----   Message from Crecencio Mc, MD sent at 04/24/2019  2:03 PM EDT ----- Regarding: wellbutrin refill /dad is tolerating the 100 mg dose,  but peter thinks he could tolerate higher dose.  can you increase the dose to 150 mg and refill for 90 days ?  Thanks   [employer]

## 2019-04-24 NOTE — Telephone Encounter (Signed)
rx sent in for wellbutrin 150mg  bid

## 2019-04-25 ENCOUNTER — Other Ambulatory Visit: Payer: Self-pay | Admitting: Internal Medicine

## 2019-04-25 DIAGNOSIS — N183 Chronic kidney disease, stage 3 unspecified: Secondary | ICD-10-CM

## 2019-04-25 DIAGNOSIS — I10 Essential (primary) hypertension: Secondary | ICD-10-CM

## 2019-04-25 NOTE — Progress Notes (Signed)
Orders placed for f/u labs.  

## 2019-04-27 ENCOUNTER — Other Ambulatory Visit: Payer: PPO

## 2019-04-27 ENCOUNTER — Other Ambulatory Visit: Payer: Self-pay

## 2019-04-27 ENCOUNTER — Other Ambulatory Visit (INDEPENDENT_AMBULATORY_CARE_PROVIDER_SITE_OTHER): Payer: PPO

## 2019-04-27 DIAGNOSIS — N183 Chronic kidney disease, stage 3 unspecified: Secondary | ICD-10-CM

## 2019-04-27 DIAGNOSIS — Z23 Encounter for immunization: Secondary | ICD-10-CM

## 2019-04-27 DIAGNOSIS — I10 Essential (primary) hypertension: Secondary | ICD-10-CM | POA: Diagnosis not present

## 2019-04-27 DIAGNOSIS — E1122 Type 2 diabetes mellitus with diabetic chronic kidney disease: Secondary | ICD-10-CM

## 2019-04-27 DIAGNOSIS — Z9189 Other specified personal risk factors, not elsewhere classified: Secondary | ICD-10-CM | POA: Diagnosis not present

## 2019-04-28 LAB — CBC WITH DIFFERENTIAL/PLATELET
Basophils Absolute: 0.1 10*3/uL (ref 0.0–0.1)
Basophils Relative: 0.7 % (ref 0.0–3.0)
Eosinophils Absolute: 0.3 10*3/uL (ref 0.0–0.7)
Eosinophils Relative: 3.6 % (ref 0.0–5.0)
HCT: 40.8 % (ref 39.0–52.0)
Hemoglobin: 13.8 g/dL (ref 13.0–17.0)
Lymphocytes Relative: 18.1 % (ref 12.0–46.0)
Lymphs Abs: 1.6 10*3/uL (ref 0.7–4.0)
MCHC: 33.8 g/dL (ref 30.0–36.0)
MCV: 100.5 fl — ABNORMAL HIGH (ref 78.0–100.0)
Monocytes Absolute: 0.7 10*3/uL (ref 0.1–1.0)
Monocytes Relative: 8.2 % (ref 3.0–12.0)
Neutro Abs: 6.1 10*3/uL (ref 1.4–7.7)
Neutrophils Relative %: 69.4 % (ref 43.0–77.0)
Platelets: 177 10*3/uL (ref 150.0–400.0)
RBC: 4.06 Mil/uL — ABNORMAL LOW (ref 4.22–5.81)
RDW: 12.8 % (ref 11.5–15.5)
WBC: 8.8 10*3/uL (ref 4.0–10.5)

## 2019-04-28 LAB — HEMOGLOBIN A1C: Hgb A1c MFr Bld: 7.5 % — ABNORMAL HIGH (ref 4.6–6.5)

## 2019-04-28 LAB — HEPATIC FUNCTION PANEL
ALT: 72 U/L — ABNORMAL HIGH (ref 0–53)
AST: 27 U/L (ref 0–37)
Albumin: 4 g/dL (ref 3.5–5.2)
Alkaline Phosphatase: 64 U/L (ref 39–117)
Bilirubin, Direct: 0.1 mg/dL (ref 0.0–0.3)
Total Bilirubin: 0.4 mg/dL (ref 0.2–1.2)
Total Protein: 6.2 g/dL (ref 6.0–8.3)

## 2019-04-28 LAB — BASIC METABOLIC PANEL
BUN: 50 mg/dL — ABNORMAL HIGH (ref 6–23)
CO2: 21 mEq/L (ref 19–32)
Calcium: 9 mg/dL (ref 8.4–10.5)
Chloride: 106 mEq/L (ref 96–112)
Creatinine, Ser: 2 mg/dL — ABNORMAL HIGH (ref 0.40–1.50)
GFR: 31.66 mL/min — ABNORMAL LOW (ref >60.00)
Glucose, Bld: 125 mg/dL — ABNORMAL HIGH (ref 70–99)
Potassium: 4.9 mEq/L (ref 3.5–5.1)
Sodium: 138 mEq/L (ref 135–145)

## 2019-04-28 LAB — LIPID PANEL
Cholesterol: 160 mg/dL (ref 0–200)
HDL: 46.2 mg/dL (ref >39.00)
NonHDL: 113.71
Total CHOL/HDL Ratio: 3
Triglycerides: 271 mg/dL — ABNORMAL HIGH (ref 0.0–149.0)
VLDL: 54.2 mg/dL — ABNORMAL HIGH (ref 0.0–40.0)

## 2019-04-28 LAB — SARS-COV-2 IGG: SARS-COV-2 IgG: 0.02

## 2019-04-28 LAB — LDL CHOLESTEROL, DIRECT: Direct LDL: 76 mg/dL

## 2019-05-19 ENCOUNTER — Other Ambulatory Visit: Payer: Self-pay

## 2019-05-19 MED ORDER — CYANOCOBALAMIN 1000 MCG/ML IJ SOLN: INTRAMUSCULAR | 1 refills | Status: AC

## 2019-05-19 MED ORDER — "SYRINGE/NEEDLE (DISP) 25G X 5/8"" 3 ML MISC": 0 refills | Status: AC

## 2019-05-19 MED ORDER — "NEEDLE (DISP) 25G X 1"" MISC": 0 refills | Status: AC

## 2019-06-01 ENCOUNTER — Encounter: Payer: Self-pay | Admitting: Internal Medicine

## 2019-06-01 ENCOUNTER — Other Ambulatory Visit: Payer: Self-pay

## 2019-06-01 ENCOUNTER — Ambulatory Visit (INDEPENDENT_AMBULATORY_CARE_PROVIDER_SITE_OTHER): Payer: PPO | Admitting: Internal Medicine

## 2019-06-01 VITALS — BP 110/66 | HR 100 | Temp 95.8°F | Resp 16 | Wt 169.0 lb

## 2019-06-01 DIAGNOSIS — R1032 Left lower quadrant pain: Secondary | ICD-10-CM

## 2019-06-01 DIAGNOSIS — R079 Chest pain, unspecified: Secondary | ICD-10-CM | POA: Diagnosis not present

## 2019-06-01 DIAGNOSIS — R0602 Shortness of breath: Secondary | ICD-10-CM | POA: Diagnosis not present

## 2019-06-01 DIAGNOSIS — I10 Essential (primary) hypertension: Secondary | ICD-10-CM

## 2019-06-01 DIAGNOSIS — R1310 Dysphagia, unspecified: Secondary | ICD-10-CM | POA: Diagnosis not present

## 2019-06-01 DIAGNOSIS — E1122 Type 2 diabetes mellitus with diabetic chronic kidney disease: Secondary | ICD-10-CM | POA: Diagnosis not present

## 2019-06-01 DIAGNOSIS — R109 Unspecified abdominal pain: Secondary | ICD-10-CM | POA: Insufficient documentation

## 2019-06-01 DIAGNOSIS — N183 Chronic kidney disease, stage 3 unspecified: Secondary | ICD-10-CM

## 2019-06-01 DIAGNOSIS — E538 Deficiency of other specified B group vitamins: Secondary | ICD-10-CM | POA: Diagnosis not present

## 2019-06-01 LAB — LIPASE: Lipase: 14 U/L (ref 11.0–59.0)

## 2019-06-01 LAB — HEPATIC FUNCTION PANEL
ALT: 48 U/L (ref 0–53)
AST: 21 U/L (ref 0–37)
Albumin: 3.7 g/dL (ref 3.5–5.2)
Alkaline Phosphatase: 96 U/L (ref 39–117)
Bilirubin, Direct: 0.1 mg/dL (ref 0.0–0.3)
Total Bilirubin: 0.5 mg/dL (ref 0.2–1.2)
Total Protein: 6 g/dL (ref 6.0–8.3)

## 2019-06-01 LAB — BASIC METABOLIC PANEL
BUN: 40 mg/dL — ABNORMAL HIGH (ref 6–23)
CO2: 22 mEq/L (ref 19–32)
Calcium: 9 mg/dL (ref 8.4–10.5)
Chloride: 105 mEq/L (ref 96–112)
Creatinine, Ser: 2.07 mg/dL — ABNORMAL HIGH (ref 0.40–1.50)
GFR: 30.42 mL/min — ABNORMAL LOW (ref >60.00)
Glucose, Bld: 138 mg/dL — ABNORMAL HIGH (ref 70–99)
Potassium: 4.5 mEq/L (ref 3.5–5.1)
Sodium: 140 mEq/L (ref 135–145)

## 2019-06-01 NOTE — Progress Notes (Addendum)
Patient ID: Lorris Carducci, male   DOB: Aug 08, 1928, 85 y.o.   MRN: [id number]   Subjective:    Patient ID: Salil Raineri, male    DOB: Jan 19, 1929, 85 y.o.   MRN: [id number]  HPI This visit occurred during the SARS-CoV-2 public health emergency.  Safety protocols were in place, including screening questions prior to the visit, additional usage of staff PPE, and extensive cleaning of exam room while observing appropriate contact time as indicated for disinfecting solutions.  Patient here for work in appt.  He had one episode of chest pain approximately one week ago.  Has not had any pain since.  Does report sob with exertion.  Some fatigue.  No increased cough or congestion.  No fever or sinus congestion.  No abdominal pain.  Ostomy working well per pt.  Some discomfort left abdomen - just above (to left) of ostomy.  Does have hernia present.  No vomiting.  Does report decreased appetite. States just can't eat as much.     Past Medical History:  Diagnosis Date  . 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 Diabetes mellitus ([place])   . History of Clostridioides difficile colitis   . Hypertension   . Osteoarthritis    Past Surgical History:  Procedure Laterality Date  . TOTAL COLECTOMY  2015   toxic megacolon with resulting ileostomy   Family History  Problem Relation Age of Onset  . Cancer Sister    Social History   Socioeconomic History  . Marital status: Divorced    Spouse name: Not on file  . Number of children: Not on file  . Years of education: Not on file  . Highest education level: Not on file  Occupational History  . Not on file  Social Needs  . Financial resource strain: Not on file  . Food insecurity    Worry: Not on file    Inability: Not on file  . Transportation needs    Medical: Not on file    Non-medical: Not on file  Tobacco Use  . Smoking status: Former Research scientist (life sciences)  . Smokeless tobacco: Never Used   Substance and Sexual Activity  . Alcohol use: Not on file  . Drug use: Never  . Sexual activity: Not Currently  Lifestyle  . Physical activity    Days per week: Not on file    Minutes per session: Not on file  . Stress: Not on file  Relationships  . Social Herbalist on phone: Not on file    Gets together: Not on file    Attends religious service: Not on file    Active member of club or organization: Not on file    Attends meetings of clubs or organizations: Not on file    Relationship status: Not on file  Other Topics Concern  . Not on file  Social History Narrative   Living alone for past 15 years with a caregiver to help with some ADL'S      Lives with daughter who is a PCP      No longer driving      Right handed       Edu- Chartered certified accountant      Walks with cane    Outpatient Encounter Medications as of 06/01/2019  Medication Sig  . Alpha-D-Galactosidase (BEANO PO) Take by mouth.  . Ascorbic Acid (VITAMIN C PO) Take by mouth daily.  Marland Kitchen buPROPion [facility]  SR) 150 MG 12 hr tablet Take 1 tablet (150 mg total) by mouth 2 (two) times daily.  . cholecalciferol (VITAMIN D3) 25 MCG (1000 UT) tablet Take 1,000 Units by mouth daily.  . cyanocobalamin (,VITAMIN B-12,) 1000 MCG/ML injection Inject 1000mcg q week x 4 weeks and then 1000mcg q month.  . loratadine (CLARITIN) 10 MG tablet Take 10 mg by mouth daily.  Marland Kitchen. losartan (COZAAR) 100 MG tablet Take 100 mg by mouth daily.  Marland Kitchen. NEEDLE, DISP, 25 G 25G X 1" MISC Use as directed with b12 injections.  Marland Kitchen. oxybutynin (DITROPAN) 5 MG tablet Take 1 tablet (5 mg total) by mouth 3 (three) times daily.  . Simethicone (GAS-X PO) Take by mouth as needed.  . SYRINGE-NEEDLE, DISP, 3 ML 25G X 5/8" 3 ML MISC Use as directed with b12 injections  . tamsulosin (FLOMAX) 0.4 MG CAPS capsule Take 0.4 mg by mouth daily after supper.   No facility-administered encounter medications on file as of 06/01/2019.    Review of  Systems  Constitutional: Positive for fatigue.       Some decreased appetite. Weight down.    HENT: Negative for congestion and sinus pressure.   Respiratory: Positive for shortness of breath. Negative for cough and chest tightness.        Reports sob with exertion.    Cardiovascular: Negative for palpitations and leg swelling.       Had the one episode of chest pain.  Lasted for a short period of time.  No pain since.  No known trigger.    Gastrointestinal: Negative for nausea and vomiting.       Some minimal discomfort adjacent to ostomy.    Genitourinary: Negative for difficulty urinating and dysuria.  Musculoskeletal: Negative for joint swelling and myalgias.  Skin: Negative for color change and rash.  Neurological: Negative for dizziness, light-headedness and headaches.  Psychiatric/Behavioral: Negative for agitation and dysphoric mood.       Objective:    Physical Exam Constitutional:      General: He is not in acute distress.    Appearance: Normal appearance. He is well-developed.  HENT:     Head: Normocephalic and atraumatic.     Right Ear: External ear normal.     Left Ear: External ear normal.  Eyes:     General: No scleral icterus.       Right eye: No discharge.        Left eye: No discharge.     Conjunctiva/sclera: Conjunctivae normal.  Neck:     Musculoskeletal: Neck supple. No muscular tenderness.  Cardiovascular:     Rate and Rhythm: Normal rate and regular rhythm.     Heart sounds: Murmur present.     Comments: 1/6 systolic murmur Pulmonary:     Effort: Pulmonary effort is normal. No respiratory distress.     Breath sounds: Normal breath sounds.  Abdominal:     General: Bowel sounds are normal.     Palpations: Abdomen is soft.     Comments: Some minimal tenderness left side abdomen - adjacent to ostomy.  Hernia present.    Musculoskeletal:        General: No swelling or tenderness.  Lymphadenopathy:     Cervical: No cervical adenopathy.  Skin:     Findings: No erythema or rash.  Neurological:     Mental Status: He is alert.  Psychiatric:        Mood and Affect: Mood normal.  Behavior: Behavior normal.     BP 110/66   Pulse 100   Temp (!) 95.8 F (35.4 C)   Resp 16   Wt 169 lb (76.7 kg)   SpO2 98%   BMI 28.12 kg/m  Wt Readings from Last 3 Encounters:  06/01/19 169 lb (76.7 kg)  02/21/19 175 lb (79.4 kg)  12/15/18 175 lb (79.4 kg)     Lab Results  Component Value Date   WBC 8.8 04/27/2019   HGB 13.8 04/27/2019   HCT 40.8 04/27/2019   PLT 177.0 04/27/2019   GLUCOSE 138 (H) 06/01/2019   CHOL 160 04/27/2019   TRIG 271.0 (H) 04/27/2019   HDL 46.20 04/27/2019   LDLDIRECT 76.0 04/27/2019   LDLCALC 105 (H) 12/15/2018   ALT 48 06/01/2019   AST 21 06/01/2019   NA 140 06/01/2019   K 4.5 06/01/2019   CL 105 06/01/2019   CREATININE 2.07 (H) 06/01/2019   BUN 40 (H) 06/01/2019   CO2 22 06/01/2019   TSH 2.54 12/15/2018   HGBA1C 7.5 (H) 04/27/2019    Mr Brain Wo Contrast  Result Date: 03/31/2019 CLINICAL DATA:  Mild dementia EXAM: MRI HEAD WITHOUT CONTRAST TECHNIQUE: Multiplanar, multiecho pulse sequences of the brain and surrounding structures were obtained without intravenous contrast. COMPARISON:  None. FINDINGS: Brain: Prominent cerebral atrophy that is generalized. Mild to moderate for age small-vessel ischemic type gliosis mainly in the periventricular white matter where the FLAIR hyperintensity is confluent. Mild chronic ischemic gliosis in the pons. No chronic blood products. No cortical infarct, collection, hydrocephalus, or mass. Vascular: Major flow voids are preserved Skull and upper cervical spine: Negative for marrow lesion Sinuses/Orbits: Negative IMPRESSION: 1. No reversible finding. 2. Prominent cerebral atrophy that is generalized. Small-vessel ischemic changes are mild to moderate for age. Electronically Signed   By: Marnee Spring M.D.   On: 03/31/2019 08:03       Assessment & Plan:   Problem  List Items Addressed This Visit    Abdominal pain    Minimal discomfort adjacent to ostomy.  Hernia present.  Bowel sounds normal.  Ostomy working well.  Recheck liver panel.  Check lipase.  Encourage increased po intake.        Relevant Orders   Lipase (Completed)   B12 deficiency    Continue B12 injections.  Check intrinsic factor.        Relevant Orders   Intrinsic Factor Antibodies   Chest pain - Primary    Chest pain - one episode.  EKG as outlined.  SOB with exertion.  Discussed further w/up.  Refer to cardiology for further evaluation and question of need for ECHO - to evaluate valve status, PAP and EF.        Relevant Orders   EKG 12-Lead (Completed)   Ambulatory referral to Cardiology   CKD (chronic kidney disease), stage III    Avoid antiinflammatories.  Recheck metabolic panel.  Stay hydrated.        Dysphagia    Has been evaluated by ENT.  Thickened liquids.  Follow.        Hypertension, essential    Blood pressure under good control.  Continue same medication regimen.  Follow pressures.  Follow metabolic panel.        Relevant Orders   Hepatic function panel (Completed)   Basic metabolic panel (today) (Completed)   SOB (shortness of breath) on exertion    SOB with exertion.  Had one episode of chest pain. Unclear etiology. SOB persistent.  Discussed further w/up.  EKG - SR no acute ischemic changes.  q waves in III and aVF.  Will have cardiology evaluate - question of need for echo to assess LV function, PAP, valve status, etc.  Consider cxr.       Relevant Orders   Ambulatory referral to Cardiology   Type 2 diabetes mellitus with diabetic chronic kidney disease (HCC)    On no medication.  Stable.  Follow.            [place], MD

## 2019-06-02 ENCOUNTER — Encounter: Payer: Self-pay | Admitting: Internal Medicine

## 2019-06-04 ENCOUNTER — Encounter: Payer: Self-pay | Admitting: Internal Medicine

## 2019-06-04 DIAGNOSIS — R079 Chest pain, unspecified: Secondary | ICD-10-CM | POA: Insufficient documentation

## 2019-06-04 DIAGNOSIS — R0602 Shortness of breath: Secondary | ICD-10-CM | POA: Insufficient documentation

## 2019-06-04 NOTE — Assessment & Plan Note (Addendum)
SOB with exertion.  Had one episode of chest pain. Unclear etiology. SOB persistent.  Discussed further w/up.  EKG - SR no acute ischemic changes.  q waves in III and aVF.  Will have cardiology evaluate - question of need for echo to assess LV function, PAP, valve status, etc.  Consider cxr.

## 2019-06-04 NOTE — Assessment & Plan Note (Signed)
On no medication.  Stable.  Follow.    

## 2019-06-04 NOTE — Addendum Note (Signed)
Addended by: Alisa Graff on: 06/04/2019 05:11 PM   Modules accepted: Orders

## 2019-06-04 NOTE — Assessment & Plan Note (Signed)
Continue B12 injections.  Check intrinsic factor.

## 2019-06-04 NOTE — Assessment & Plan Note (Signed)
Minimal discomfort adjacent to ostomy.  Hernia present.  Bowel sounds normal.  Ostomy working well.  Recheck liver panel.  Check lipase.  Encourage increased po intake.

## 2019-06-04 NOTE — Assessment & Plan Note (Signed)
Avoid antiinflammatories.  Recheck metabolic panel.  Stay hydrated.

## 2019-06-04 NOTE — Assessment & Plan Note (Signed)
Has been evaluated by ENT.  Thickened liquids.  Follow.

## 2019-06-04 NOTE — Assessment & Plan Note (Signed)
Blood pressure under good control.  Continue same medication regimen.  Follow pressures.  Follow metabolic panel.   

## 2019-06-04 NOTE — Assessment & Plan Note (Signed)
Chest pain - one episode.  EKG as outlined.  SOB with exertion.  Discussed further w/up.  Refer to cardiology for further evaluation and question of need for ECHO - to evaluate valve status, PAP and EF.

## 2019-06-05 LAB — INTRINSIC FACTOR ANTIBODIES: Intrinsic Factor: POSITIVE — AB

## 2019-06-06 ENCOUNTER — Encounter: Payer: Self-pay | Admitting: Internal Medicine

## 2019-06-28 ENCOUNTER — Other Ambulatory Visit: Payer: Self-pay

## 2019-06-28 MED ORDER — LOSARTAN POTASSIUM 100 MG PO TABS: 100.0000 mg | ORAL_TABLET | Freq: Every day | ORAL | 2 refills | Status: AC

## 2019-06-28 MED ORDER — TAMSULOSIN HCL 0.4 MG PO CAPS: 0.4000 mg | ORAL_CAPSULE | Freq: Every day | ORAL | 2 refills | Status: AC

## 2019-07-09 NOTE — Progress Notes (Signed)
New Outpatient Visit Date: 07/13/2019  Referring Provider: Dale Ebro, MD [address] [place],  [place] [address]  Chief Complaint: Shortness of breath and chest pain  HPI:  [name] is a 85 y.o. male who is being seen today for the evaluation of chest pain at the request of Dr. Lorin Picket. 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 He was seen by Dr. Lorin Picket in mid November for evaluation of a single episode of chest pain.  EKG at that time showed normal sinus rhythm with inferior Q-waves.  Today, Leroy Shepard reports that he feels okay.  He has chronic exertional dyspnea when walking from one Leroy Shepard of the house to the other, though his daughter (Dr. Darrick Huntsman) feels like this has worsened over the last year.  He came to live with Dr. Darrick Huntsman in 11/2018 after having previously lived in [place].  He was fairly sedentary while there.  Dr. Darrick Huntsman has tried to get her father to be more active but has noticed that he is easily winded.  He had an episode of chest tightness while showering a few months ago, which lasted about 20 minutes.  It has not recurred.  It was accompanied by dyspnea and fatigue.  He also had an episode of hypotension lasting about 3 days after oxybutynin was switched from sustained-release to short acting formulation.  This medication has now been stopped.  Leroy Shepard denies a history of prior cardiac disease and testing.  He chronically sleeps on a wedge due to aspiration but denies orthopnea as well as PND and leg edema.  He was lightheaded in the setting of his hypotension with oxybutynin adjustment, though this has resolved.  --------------------------------------------------------------------------------------------------  Cardiovascular History & Procedures: Cardiovascular Problems:  Chest pain  Dyspnea  on exertion  Risk Factors:  Hypertension, diabetes mellitus, male gender, prior tobacco use, and age > 47  Cath/PCI:  None.  CV Surgery:  None.  EP Procedures and Devices:  None.  Non-Invasive Evaluation(s):  None.  Recent CV Pertinent Labs: Lab Results  Component Value Date   CHOL 160 04/27/2019   HDL 46.20 04/27/2019   LDLCALC 105 (H) 12/15/2018   LDLDIRECT 76.0 04/27/2019   TRIG 271.0 (H) 04/27/2019   CHOLHDL 3 04/27/2019   K 4.5 06/01/2019   BUN 40 (H) 06/01/2019   BUN 48 (A) 06/07/2018   CREATININE 2.07 (H) 06/01/2019    --------------------------------------------------------------------------------------------------  Past Medical History:  Diagnosis Date  . 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 Diabetes mellitus (HCC)   . History of Clostridioides difficile colitis   . Hypertension   . Osteoarthritis     Past Surgical History:  Procedure Laterality Date  . TOTAL COLECTOMY  2015   toxic megacolon with resulting ileostomy    Current Meds  Medication Sig  . Ascorbic Acid (VITAMIN C PO) Take by mouth daily.  Marland Kitchen buPROPion (WELLBUTRIN SR) 150 MG 12 hr tablet Take 1 tablet (150 mg total) by mouth 2 (two) times daily.  . cholecalciferol (VITAMIN D3) 25 MCG (1000 UT) tablet Take 1,000 Units by mouth daily.  . cyanocobalamin (,VITAMIN B-12,) 1000 MCG/ML injection Inject q week x 4 weeks and then q month.  . loratadine (CLARITIN) 10 MG tablet Take 10 mg by mouth daily.  Marland Kitchen losartan (COZAAR) 100 MG  tablet Take 1 tablet (100 mg total) by mouth daily.  Marland Kitchen NEEDLE, DISP, 25 G 25G X 1" MISC Use as directed with b12 injections.  . Simethicone (GAS-X PO) Take by mouth as needed.  . SYRINGE-NEEDLE, DISP, 3 ML 25G X 5/8" 3 ML MISC Use as directed with b12 injections  . tamsulosin (FLOMAX) 0.4 MG CAPS capsule Take 1 capsule (0.4 mg total) by mouth daily after supper.    Allergies:  Patient has no allergy information on record.  Social History   Tobacco Use  . Smoking status: Former Research scientist (life sciences)  . Smokeless tobacco: Never Used  Substance Use Topics  . Alcohol use: Not on file  . Drug use: Never    Family History  Problem Relation Age of Onset  . Cancer Sister   . Heart failure Mother        diastolic    Review of Systems: A 12-system review of systems was performed and was negative except as noted in the HPI.  --------------------------------------------------------------------------------------------------  Physical Exam: BP 112/60 (BP Location: Right Arm, Patient Position: Sitting, Cuff Size: Normal)   Pulse 92   Ht 5\' 6"  (1.676 m)   Wt 163 lb (73.9 kg)   SpO2 97%   BMI 26.31 kg/m   General: NAD.  Seated in a wheelchair, accompanied by his daughter. HEENT: No conjunctival pallor or scleral icterus. Facemask in place. Neck: Supple without lymphadenopathy, thyromegaly, JVD, or HJR. No carotid bruit. Lungs: Normal work of breathing. Clear to auscultation bilaterally without wheezes or crackles. Heart: Regular rate and rhythm without murmurs, rubs, or gallops. Non-displaced PMI. Abd: Bowel sounds present. Soft, NT/ND. Ext: Trace ankle edema bilaterally. Radial, PT, and DP pulses are 2+ bilaterally Skin: Warm and dry without rash. Psych: Normal mood and affect.  EKG: Normal sinus rhythm with inferior Q waves.  Lab Results  Component Value Date   WBC 8.8 04/27/2019   HGB 13.8 04/27/2019   HCT 40.8 04/27/2019   MCV 100.5 (H) 04/27/2019   PLT 177.0 04/27/2019    Lab Results  Component Value Date   NA 140 06/01/2019   K 4.5 06/01/2019   CL 105 06/01/2019   CO2 22 06/01/2019   BUN 40 (H) 06/01/2019   CREATININE 2.07 (H) 06/01/2019   GLUCOSE 138 (H) 06/01/2019   ALT 48 06/01/2019    Lab Results  Component Value Date   CHOL 160 04/27/2019   HDL 46.20 04/27/2019   LDLCALC 105 (H) 12/15/2018   LDLDIRECT 76.0 04/27/2019   TRIG 271.0 (H)  04/27/2019   CHOLHDL 3 04/27/2019     --------------------------------------------------------------------------------------------------  ASSESSMENT AND PLAN: Dyspnea on exertion and chest pain: Exertional dyspnea has been chronic but seems to be worsening over the last 6 to 12 months.  Mr. Dollard also experienced one episode of chest pain lasting about 20 minutes while showering.  It is uncertain what is driving his symptoms though certainly atherosclerotic cardiovascular disease and cardiomyopathy are considerations, given his risk factors that include hypertension, diabetes mellitus, remote tobacco use, and age.  EKG today again shows inferior Q waves suggestive of prior inferior MI.  We have agreed to obtain a transthoracic echocardiogram.  Given his other comorbidities, we would like to avoid ischemia testing if at all possible in favor of medical therapies aimed at symptom relief.  Hypertension: Blood pressure well controlled today.  No medication changes at this time.  Follow-up: Return to clinic in 1 month.  Nelva Bush, MD 07/13/2019 2:14 PM

## 2019-07-13 ENCOUNTER — Ambulatory Visit (INDEPENDENT_AMBULATORY_CARE_PROVIDER_SITE_OTHER): Payer: PPO | Admitting: Internal Medicine

## 2019-07-13 ENCOUNTER — Other Ambulatory Visit: Payer: Self-pay

## 2019-07-13 ENCOUNTER — Encounter: Payer: Self-pay | Admitting: Internal Medicine

## 2019-07-13 VITALS — BP 112/60 | HR 92 | Ht 66.0 in | Wt 163.0 lb

## 2019-07-13 DIAGNOSIS — R0609 Other forms of dyspnea: Secondary | ICD-10-CM

## 2019-07-13 DIAGNOSIS — R0600 Dyspnea, unspecified: Secondary | ICD-10-CM

## 2019-07-13 DIAGNOSIS — I10 Essential (primary) hypertension: Secondary | ICD-10-CM | POA: Diagnosis not present

## 2019-07-13 DIAGNOSIS — R079 Chest pain, unspecified: Secondary | ICD-10-CM

## 2019-07-13 NOTE — Patient Instructions (Signed)
Medication Instructions:  Your physician recommends that you continue on your current medications as directed. Please refer to the Current Medication list given to you today.  *If you need a refill on your cardiac medications before your next appointment, please call your pharmacy*  Lab Work: none If you have labs (blood work) drawn today and your tests are completely normal, you will receive your results only by: . MyChart Message (if you have MyChart) OR . A paper copy in the mail If you have any lab test that is abnormal or we need to change your treatment, we will call you to review the results.  Testing/Procedures: Your physician has requested that you have an echocardiogram. Echocardiography is a painless test that uses sound waves to create images of your heart. It provides your doctor with information about the size and shape of your heart and how well your heart's chambers and valves are working. This procedure takes approximately one hour. There are no restrictions for this procedure. You may get an IV, if needed, to receive an ultrasound enhancing agent through to better visualize your heart.   Follow-Up: At [facility], you and your health needs are our priority.  As part of our continuing mission to provide you with exceptional heart care, we have created designated Provider Care Teams.  These Care Teams include your primary Cardiologist (physician) and Advanced Practice Providers (APPs -  Physician Assistants and Nurse Practitioners) who all work together to provide you with the care you need, when you need it.  Your next appointment:   1 month(s)  The format for your next appointment:   In Person  Provider:    You may see DR CHRISTOPHER END or one of the following Advanced Practice Providers on your designated Care Team:    Christopher Berge, NP  Ryan Dunn, PA-C  Jacquelyn Visser, PA-C   Echocardiogram An echocardiogram is a procedure that uses painless sound  waves (ultrasound) to produce an image of the heart. Images from an echocardiogram can provide important information about:  Signs of coronary artery disease (CAD).  Aneurysm detection. An aneurysm is a weak or damaged part of an artery wall that bulges out from the normal force of blood pumping through the body.  Heart size and shape. Changes in the size or shape of the heart can be associated with certain conditions, including heart failure, aneurysm, and CAD.  Heart muscle function.  Heart valve function.  Signs of a past heart attack.  Fluid buildup around the heart.  Thickening of the heart muscle.  A tumor or infectious growth around the heart valves. Tell a health care provider about:  Any allergies you have.  All medicines you are taking, including vitamins, herbs, eye drops, creams, and over-the-counter medicines.  Any blood disorders you have.  Any surgeries you have had.  Any medical conditions you have.  Whether you are pregnant or may be pregnant. What are the risks? Generally, this is a safe procedure. However, problems may occur, including:  Allergic reaction to dye (contrast) that may be used during the procedure. What happens before the procedure? No specific preparation is needed. You may eat and drink normally. What happens during the procedure?   An IV tube may be inserted into one of your veins.  You may receive contrast through this tube. A contrast is an injection that improves the quality of the pictures from your heart.  A gel will be applied to your chest.  A wand-like tool (transducer)   will be moved over your chest. The gel will help to transmit the sound waves from the transducer.  The sound waves will harmlessly bounce off of your heart to allow the heart images to be captured in real-time motion. The images will be recorded on a computer. The procedure may vary among health care providers and hospitals. What happens after the  procedure?  You may return to your normal, everyday life, including diet, activities, and medicines, unless your health care provider tells you not to do that. Summary  An echocardiogram is a procedure that uses painless sound waves (ultrasound) to produce an image of the heart.  Images from an echocardiogram can provide important information about the size and shape of your heart, heart muscle function, heart valve function, and fluid buildup around your heart.  You do not need to do anything to prepare before this procedure. You may eat and drink normally.  After the echocardiogram is completed, you may return to your normal, everyday life, unless your health care provider tells you not to do that. This information is not intended to replace advice given to you by your health care provider. Make sure you discuss any questions you have with your health care provider. Document Revised: 10/20/2018 Document Reviewed: 08/01/2016 Elsevier Patient Education  [employer].

## 2019-07-27 ENCOUNTER — Ambulatory Visit (INDEPENDENT_AMBULATORY_CARE_PROVIDER_SITE_OTHER): Payer: PPO

## 2019-07-27 ENCOUNTER — Other Ambulatory Visit: Payer: Self-pay

## 2019-07-27 DIAGNOSIS — R0609 Other forms of dyspnea: Secondary | ICD-10-CM

## 2019-07-27 DIAGNOSIS — R079 Chest pain, unspecified: Secondary | ICD-10-CM

## 2019-07-27 DIAGNOSIS — R0600 Dyspnea, unspecified: Secondary | ICD-10-CM

## 2019-07-27 MED ORDER — PERFLUTREN LIPID MICROSPHERE
1.0000 mL | INTRAVENOUS | Status: AC | PRN
  Administered 2019-07-27: 2 mL via INTRAVENOUS

## 2019-07-31 ENCOUNTER — Telehealth: Payer: Self-pay | Admitting: *Deleted

## 2019-07-31 NOTE — Telephone Encounter (Signed)
-----   Message from Yvonne Kendall, MD sent at 07/31/2019  8:25 AM EST ----- Regarding: FW: Echo findings Hi Victorino Dike,  Do you mind cancelling Mr. Bockrath's f/u appointment with me?  He can see [place] on a prn basis.  Thanks.  Thayer Ohm ----- Message ----- From: Sherlene Shams, MD Sent: 07/31/2019   8:10 AM EST To: Yvonne Kendall, MD Subject: RE: Echo findings                              Hi Dr End,  He has no new issues and hates going out in the cold (transplanted [place]) so let's defer f=r now.  Thank you again for your caring and compassionate disposition !  Have a great week as well.  Rosey Bath ----- Message ----- From: Yvonne Kendall, MD Sent: 07/31/2019   7:11 AM EST To: Sherlene Shams, MD Subject: RE: Echo findings                              Hi Dr. Darrick Huntsman,  I think it is fine to defer your dad's follow-up with me as long as he does not have any new issues.  I am happy to see him at any time if questions or concerns arise.  If you would like to cancel his follow-up appointment, let me know and I will take care of it.  Have a great week.  Thayer Ohm ----- Message ----- From: Sherlene Shams, MD Sent: 07/29/2019  11:39 PM EST To: Yvonne Kendall, MD Subject: RE: Echo findings                              Hi Dr. Okey Dupre,  Thank you so much for seeing him and confirming what I thought.   I agree with holding off on any further workup.  If you would still like to see him for follow up,  let  me know.  I  hope you are having a good weekend.   Rosey Bath  ----- Message ----- From: Yvonne Kendall, MD Sent: 07/29/2019   8:42 PM EST To: Sherlene Shams, MD Subject: Echo findings                                  Hi Dr. Darrick Huntsman,  I hope that you are doing well.  I had a chance to look at your dad's echo, and overall it looks pretty good.  He has a vigorous LVEF and some mild diastolic dysfunction (not unexpected given his age).  I don't see any obvious wall motion abnormalities.  His aortic valve  is quite thickened but our measurements suggest only mild stenosis and regurgitation.  It is possible that the stenosis may be a little worse than the gradients suggest but I don't think it is severe.  Underlying ischemia is always a possibility, but based on our conversation in the office, I think it is reasonable to hold off on further testing unless you and your dad feel otherwise.  Please let me know if any questions or concerns come up.  Best wishes.  Chris End

## 2019-07-31 NOTE — Telephone Encounter (Signed)
Appointment cancelled

## 2019-08-03 ENCOUNTER — Ambulatory Visit: Payer: PPO | Admitting: Cardiovascular Disease

## 2019-08-07 DIAGNOSIS — I469 Cardiac arrest, cause unspecified: Secondary | ICD-10-CM | POA: Diagnosis not present

## 2019-08-09 ENCOUNTER — Telehealth: Payer: Self-pay | Admitting: Internal Medicine

## 2019-08-09 NOTE — Telephone Encounter (Signed)
Death certificate given to Dr Lorin Picket.

## 2019-08-09 NOTE — Telephone Encounter (Signed)
Death certificate faxed per request. Herbert Deaner at [employer] to let her know. Fax went through and original copy was placed up front for pick up. Pt changed to deceased in system.

## 2019-08-09 NOTE — Telephone Encounter (Signed)
Dr. Roby Lofts pt.

## 2019-08-09 NOTE — Telephone Encounter (Signed)
Pt's death certificate is up front in color folder.

## 2019-08-14 DIAGNOSIS — 419620001 Death: Secondary | SNOMED CT | POA: Diagnosis not present

## 2019-08-14 DEATH — deceased

## 2019-08-31 ENCOUNTER — Ambulatory Visit: Payer: PPO | Admitting: Internal Medicine

## 2019-09-19 ENCOUNTER — Ambulatory Visit: Payer: PPO | Admitting: Neurology

## 2020-12-30 IMAGING — MR MR HEAD W/O CM
10 series · 48 of 48 positions shown · non-contrast
Comparison: None.

CLINICAL DATA: Mild dementia

EXAM:
MRI HEAD WITHOUT CONTRAST
TECHNIQUE: Multiplanar, multiecho pulse sequences of the brain and surrounding
structures were obtained without intravenous contrast.

[Series 2: T1 · sagittal · 5.0mm · 0.45mm/px · 3 of 23 slices shown]
[im 1/23]
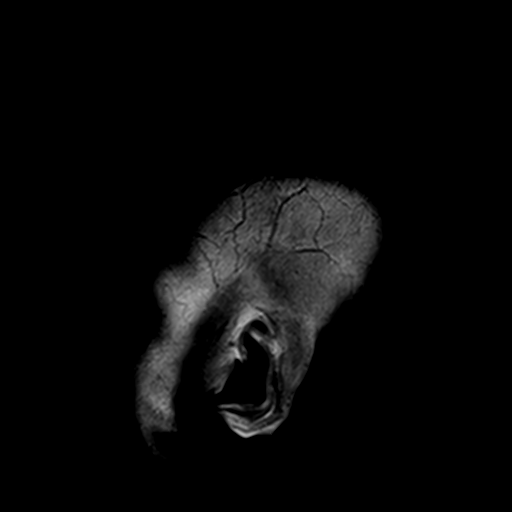
[im 12/23]
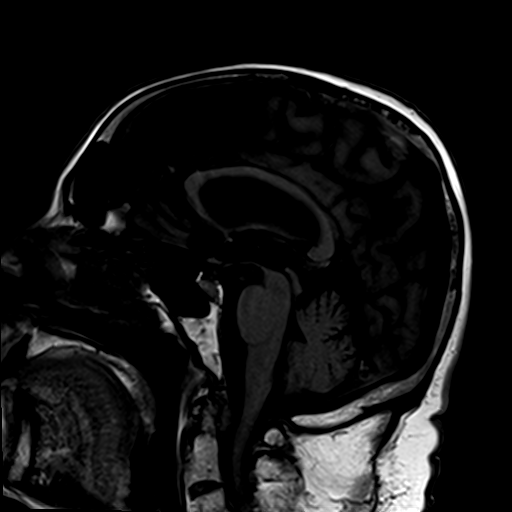
[im 23/23]
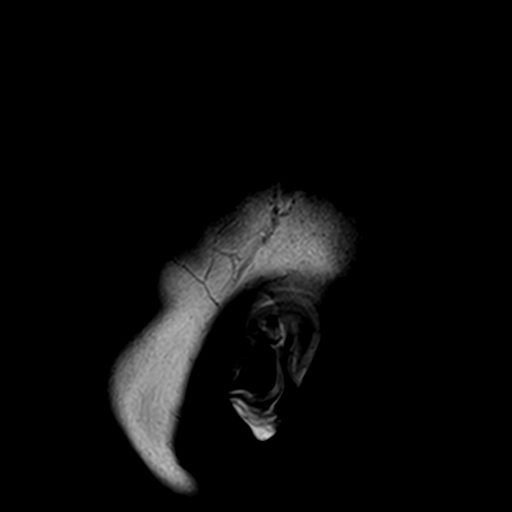

[Series 3: DWI · axial · 3.0mm · 1.80mm/px · z∈[-17,+126]mm · 10 of 104 slices shown (1 of 4)]
[im 1/104]
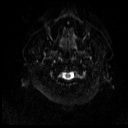
[im 12/104]
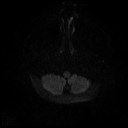
[im 23/104]
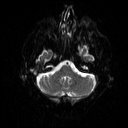
[im 35/104]
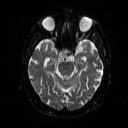
[im 46/104]
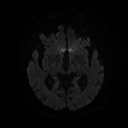
[im 58/104]
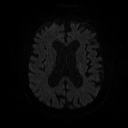
[im 69/104]
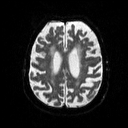
[im 81/104]
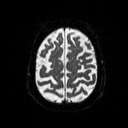
[im 92/104]
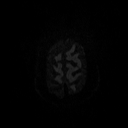
[im 104/104]
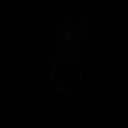

[Series 4: DWI · axial · 3.0mm · 1.80mm/px · z∈[-17,+126]mm · 4 of 52 slices shown (2 of 4)]
[im 1/52]
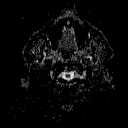
[im 18/52]
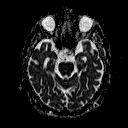
[im 35/52]
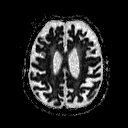
[im 52/52]
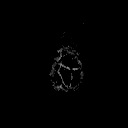

[Series 5: DWI · coronal · 5.0mm · 1.80mm/px · 6 of 71 slices shown (3 of 4)]
[im 1/71]
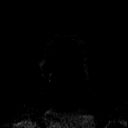
[im 15/71]
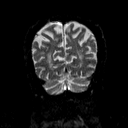
[im 29/71]
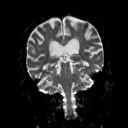
[im 43/71]
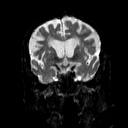
[im 57/71]
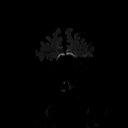
[im 71/71]
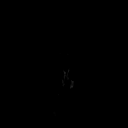

[Series 6: DWI · coronal · 5.0mm · 1.80mm/px · 3 of 36 slices shown (4 of 4)]
[im 1/36]
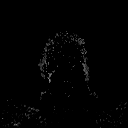
[im 18/36]
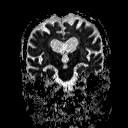
[im 36/36]
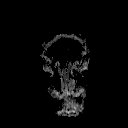

[Series 7: T2 · axial · 5.0mm · 0.51mm/px · z∈[-39,+117]mm · 2 of 24 slices shown (1 of 2)]
[im 1/24]
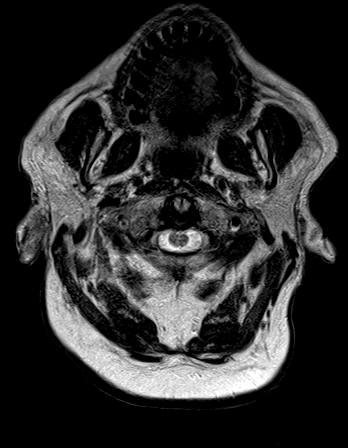
[im 24/24]
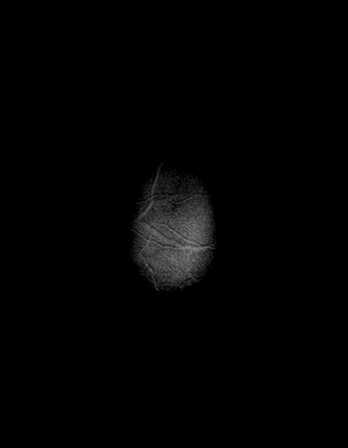

[Series 8: FLAIR · axial · 3.0mm · 0.45mm/px · z∈[-6,+136]mm · 3 of 34 slices shown]
[im 1/34]
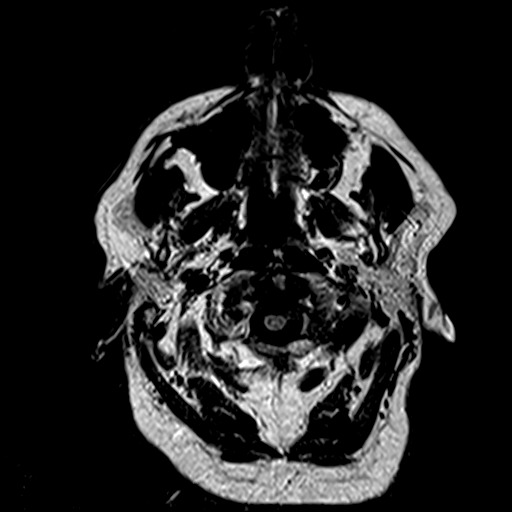
[im 17/34]
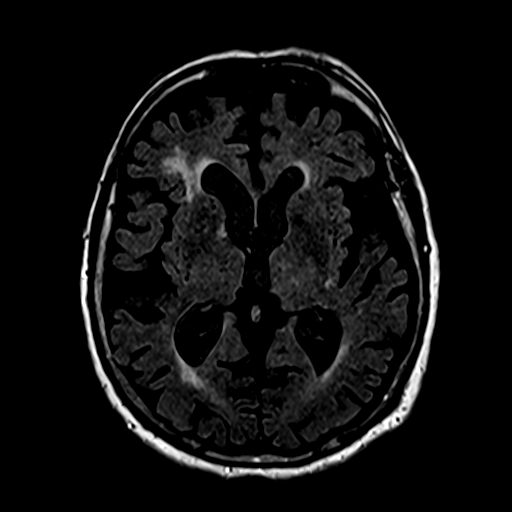
[im 34/34]
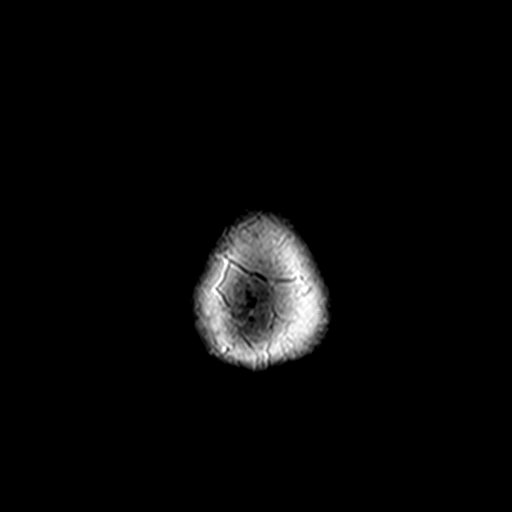

[Series 10: swi_images · axial · 4.0mm · 0.90mm/px · z∈[-20,+125]mm · 3 of 40 slices shown]
[im 1/40]
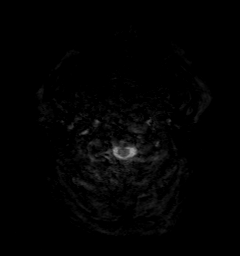
[im 20/40]
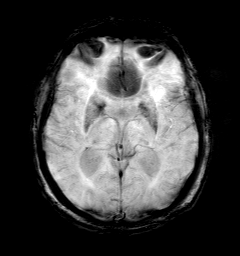
[im 40/40]
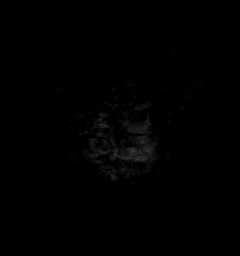

[Series 11: t1_mpr_tra · axial · 1.0mm · 0.71mm/px · z∈[-16,+123]mm · 12 of 144 slices shown]
[im 1/144]
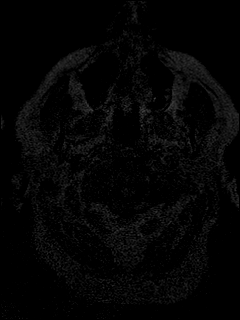
[im 14/144]
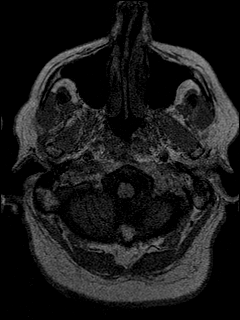
[im 27/144]
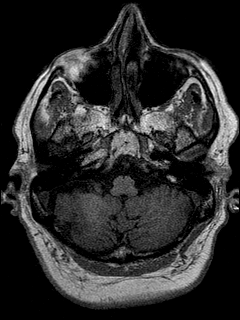
[im 40/144]
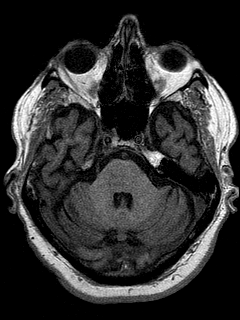
[im 53/144]
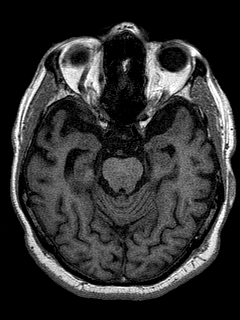
[im 66/144]
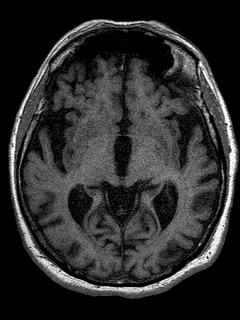
[im 79/144]
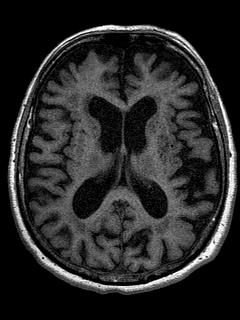
[im 92/144]
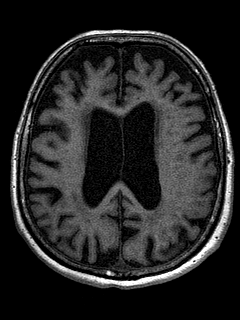
[im 105/144]
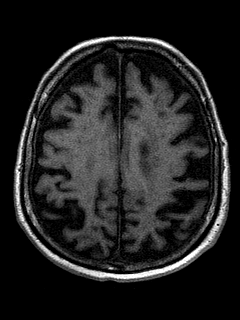
[im 118/144]
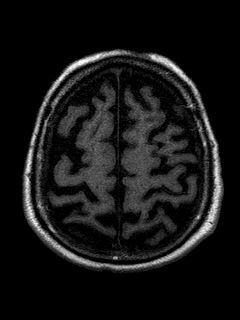
[im 131/144]
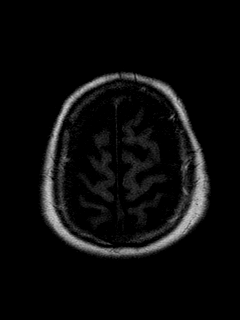
[im 144/144]
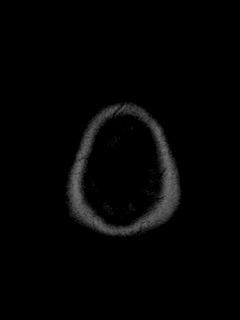

[Series 12: T2 · coronal · 5.0mm · 0.45mm/px · 2 of 28 slices shown (2 of 2)]
[im 1/28]
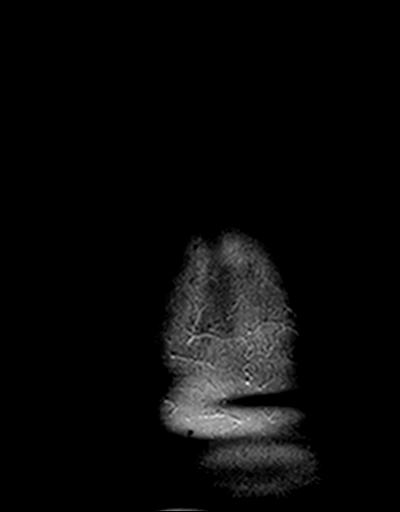
[im 28/28]
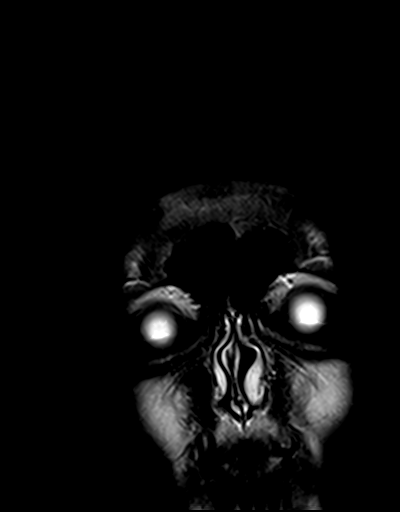

[48 of 48 positions shown; findings below may reference images not displayed]

FINDINGS: Brain: Prominent cerebral atrophy that is generalized. Mild to
moderate for age small-vessel ischemic type gliosis mainly in the
periventricular white matter where the FLAIR hyperintensity is
confluent. Mild chronic ischemic gliosis in the pons. No chronic
blood products. No cortical infarct, collection, hydrocephalus, or
mass.

Vascular: Major flow voids are preserved

Skull and upper cervical spine: Negative for marrow lesion

Sinuses/Orbits: Negative
IMPRESSION: 1. No reversible finding.
2. Prominent cerebral atrophy that is generalized. Small-vessel
ischemic changes are mild to moderate for age.
